# Patient Record
Sex: Male | Born: 1965 | Race: White | Hispanic: No
Health system: Southern US, Community
[De-identification: ages and names within clinical notes are randomized; demographics above are authoritative.]

---

## 2005-04-23 ENCOUNTER — Emergency Department (HOSPITAL_COMMUNITY): Admission: EM | Admit: 2005-04-23 | Discharge: 2005-04-23 | Payer: Self-pay | Admitting: *Deleted

## 2005-07-16 ENCOUNTER — Ambulatory Visit (HOSPITAL_COMMUNITY): Admission: RE | Admit: 2005-07-16 | Discharge: 2005-07-16 | Payer: Self-pay | Admitting: Neurosurgery

## 2008-12-29 ENCOUNTER — Emergency Department (HOSPITAL_COMMUNITY): Admission: EM | Admit: 2008-12-29 | Discharge: 2008-12-29 | Payer: Self-pay | Admitting: Emergency Medicine

## 2011-07-25 ENCOUNTER — Inpatient Hospital Stay (INDEPENDENT_AMBULATORY_CARE_PROVIDER_SITE_OTHER)
Admission: RE | Admit: 2011-07-25 | Discharge: 2011-07-25 | Disposition: A | Discharge status: Home / Self Care | Payer: Self-pay | Source: Ambulatory Visit | Attending: Emergency Medicine | Admitting: Emergency Medicine

## 2011-07-25 ENCOUNTER — Ambulatory Visit (INDEPENDENT_AMBULATORY_CARE_PROVIDER_SITE_OTHER): Payer: Self-pay

## 2011-07-25 DIAGNOSIS — S92919A Unspecified fracture of unspecified toe(s), initial encounter for closed fracture: Secondary | ICD-10-CM
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
## 2013-04-01 ENCOUNTER — Encounter (HOSPITAL_COMMUNITY): Payer: Self-pay | Admitting: *Deleted

## 2013-04-01 ENCOUNTER — Emergency Department (HOSPITAL_COMMUNITY)
Admission: EM | Admit: 2013-04-01 | Discharge: 2013-04-02 | Disposition: A | Discharge status: Home / Self Care | Payer: Self-pay | Attending: Emergency Medicine | Admitting: Emergency Medicine

## 2013-04-01 DIAGNOSIS — R32 Unspecified urinary incontinence: Secondary | ICD-10-CM | POA: Insufficient documentation

## 2013-04-01 DIAGNOSIS — E860 Dehydration: Secondary | ICD-10-CM | POA: Insufficient documentation

## 2013-04-01 DIAGNOSIS — R112 Nausea with vomiting, unspecified: Secondary | ICD-10-CM | POA: Insufficient documentation

## 2013-04-01 DIAGNOSIS — F1010 Alcohol abuse, uncomplicated: Secondary | ICD-10-CM | POA: Insufficient documentation

## 2013-04-01 DIAGNOSIS — F29 Unspecified psychosis not due to a substance or known physiological condition: Secondary | ICD-10-CM | POA: Insufficient documentation

## 2013-04-01 DIAGNOSIS — R569 Unspecified convulsions: Secondary | ICD-10-CM | POA: Insufficient documentation

## 2013-04-01 DIAGNOSIS — R55 Syncope and collapse: Secondary | ICD-10-CM | POA: Insufficient documentation

## 2013-04-01 DIAGNOSIS — F17200 Nicotine dependence, unspecified, uncomplicated: Secondary | ICD-10-CM | POA: Insufficient documentation

## 2013-04-01 DIAGNOSIS — H9319 Tinnitus, unspecified ear: Secondary | ICD-10-CM | POA: Insufficient documentation

## 2013-04-01 DIAGNOSIS — R42 Dizziness and giddiness: Secondary | ICD-10-CM | POA: Insufficient documentation

## 2013-04-01 LAB — CBC WITH DIFFERENTIAL/PLATELET
Basophils Absolute: 0 10*3/uL (ref 0.0–0.1)
Basophils Relative: 1 % (ref 0–1)
Hemoglobin: 14 g/dL (ref 13.0–17.0)
Lymphocytes Relative: 43 % (ref 12–46)
Lymphs Abs: 3.6 10*3/uL (ref 0.7–4.0)
MCH: 35.4 pg — ABNORMAL HIGH (ref 26.0–34.0)
MCHC: 35.3 g/dL (ref 30.0–36.0)
MCV: 100.3 fL — ABNORMAL HIGH (ref 78.0–100.0)
Monocytes Absolute: 0.7 10*3/uL (ref 0.1–1.0)
Neutrophils Relative %: 46 % (ref 43–77)
RBC: 3.96 MIL/uL — ABNORMAL LOW (ref 4.22–5.81)
WBC: 8.4 10*3/uL (ref 4.0–10.5)

## 2013-04-01 LAB — COMPREHENSIVE METABOLIC PANEL
ALT: 32 U/L (ref 0–53)
Albumin: 3.9 g/dL (ref 3.5–5.2)
BUN: 12 mg/dL (ref 6–23)
CO2: 30 mEq/L (ref 19–32)
GFR calc Af Amer: >90 mL/min (ref >90)
GFR calc non Af Amer: >90 mL/min (ref >90)
Glucose, Bld: 100 mg/dL — ABNORMAL HIGH (ref 70–99)
Total Bilirubin: 0.2 mg/dL — ABNORMAL LOW (ref 0.3–1.2)

## 2013-04-01 LAB — URINALYSIS, ROUTINE W REFLEX MICROSCOPIC
Bilirubin Urine: NEGATIVE
Glucose, UA: NEGATIVE mg/dL
Urobilinogen, UA: 0.2 mg/dL (ref 0.0–1.0)
pH: 6 (ref 5.0–8.0)

## 2013-04-01 LAB — URINE MICROSCOPIC-ADD ON

## 2013-04-01 LAB — RAPID URINE DRUG SCREEN, HOSP PERFORMED
Benzodiazepines: NOT DETECTED
Cocaine: NOT DETECTED
Opiates: NOT DETECTED
Tetrahydrocannabinol: POSITIVE — AB

## 2013-04-01 LAB — ETHANOL: Alcohol, Ethyl (B): 41 mg/dL — ABNORMAL HIGH (ref 0–11)

## 2013-04-01 MED ORDER — SODIUM CHLORIDE 0.9 % IV SOLN
1000.0000 mL | INTRAVENOUS | Status: DC
  Administered 2013-04-01: 1000 mL via INTRAVENOUS

## 2013-04-01 MED ORDER — SODIUM CHLORIDE 0.9 % IV SOLN
1000.0000 mL | Freq: Once | INTRAVENOUS | Status: AC
  Administered 2013-04-01: 1000 mL via INTRAVENOUS

## 2013-04-01 MED ORDER — ONDANSETRON HCL 4 MG/2ML IJ SOLN
4.0000 mg | Freq: Once | INTRAMUSCULAR | Status: AC
  Administered 2013-04-01: 4 mg via INTRAVENOUS
  Filled 2013-04-01: qty 2
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
## 2013-04-01 NOTE — ED Notes (Signed)
Delay explained to pt.  

## 2013-04-01 NOTE — ED Notes (Signed)
Per family, pt had LOC for about 1 minute.  Pt reports being outside today quite a bit and thinks he may be dehydrated.  At present, alert and oriented.  No known injury.

## 2013-04-02 MED ORDER — ONDANSETRON HCL 4 MG PO TABS: 4.0000 mg | ORAL_TABLET | Freq: Four times a day (QID) | ORAL | Status: AC

## 2013-04-04 LAB — POCT I-STAT TROPONIN I: Troponin i, poc: 0 ng/mL (ref 0.00–0.08)

## 2013-04-04 LAB — URINE CULTURE: Colony Count: NO GROWTH

## 2020-01-26 ENCOUNTER — Ambulatory Visit: Payer: Self-pay

## 2020-01-27 ENCOUNTER — Ambulatory Visit: Payer: Self-pay

## 2020-02-28 ENCOUNTER — Ambulatory Visit: Payer: Self-pay

## 2020-03-02 ENCOUNTER — Emergency Department (HOSPITAL_COMMUNITY)
Admission: EM | Admit: 2020-03-02 | Discharge: 2020-03-02 | Disposition: A | Discharge status: Home / Self Care | Payer: Self-pay | Attending: Emergency Medicine | Admitting: Emergency Medicine

## 2020-03-02 ENCOUNTER — Encounter (HOSPITAL_COMMUNITY): Payer: Self-pay

## 2020-03-02 ENCOUNTER — Other Ambulatory Visit: Payer: Self-pay

## 2020-03-02 DIAGNOSIS — M79604 Pain in right leg: Secondary | ICD-10-CM | POA: Insufficient documentation

## 2020-03-02 DIAGNOSIS — Z5321 Procedure and treatment not carried out due to patient leaving prior to being seen by health care provider: Secondary | ICD-10-CM | POA: Insufficient documentation

## 2020-03-02 DIAGNOSIS — R1031 Right lower quadrant pain: Secondary | ICD-10-CM

## 2020-03-02 NOTE — ED Triage Notes (Signed)
Pt reports has had intermittent pain in r groin and thigh for the past 2 or 3 months.  Reports was moving furniture yesterday and felt something pop in r thigh.

## 2020-03-05 ENCOUNTER — Encounter (HOSPITAL_COMMUNITY): Payer: Self-pay | Admitting: *Deleted

## 2020-04-16 ENCOUNTER — Emergency Department: Admission: EM | Admit: 2020-04-16 | Discharge: 2020-04-16 | Discharge status: Against Medical Advice | Payer: Self-pay

## 2020-04-16 NOTE — ED Triage Notes (Signed)
Pt requesting ice. Informed he needs to wait until triaged to make sure ok to drink.

## 2020-04-16 NOTE — ED Triage Notes (Signed)
First nurse note- pt fell on piece of pvc pipe on Friday or Saturday.  swelling to right submandibular. No dyspnea. Handling secretions.

## 2020-04-16 NOTE — ED Triage Notes (Signed)
Pt to first nurse desk and states " you can take me off the list I am going to get ice" and turned and walked out.

## 2022-11-19 ENCOUNTER — Other Ambulatory Visit: Payer: Self-pay

## 2022-11-19 ENCOUNTER — Emergency Department (HOSPITAL_COMMUNITY)
Admission: EM | Admit: 2022-11-19 | Discharge: 2022-11-19 | Disposition: A | Discharge status: Home / Self Care | Payer: 59 | Attending: Emergency Medicine | Admitting: Emergency Medicine

## 2022-11-19 ENCOUNTER — Encounter (HOSPITAL_COMMUNITY): Payer: Self-pay | Admitting: Emergency Medicine

## 2022-11-19 DIAGNOSIS — M542 Cervicalgia: Secondary | ICD-10-CM | POA: Diagnosis present

## 2022-11-19 DIAGNOSIS — M5416 Radiculopathy, lumbar region: Secondary | ICD-10-CM | POA: Insufficient documentation

## 2022-11-19 DIAGNOSIS — M5412 Radiculopathy, cervical region: Secondary | ICD-10-CM

## 2022-11-19 MED ORDER — TRAMADOL HCL 50 MG PO TABS: 50.0000 mg | ORAL_TABLET | Freq: Four times a day (QID) | ORAL | 0 refills | Status: AC | PRN

## 2022-11-19 MED ORDER — PREDNISONE 50 MG PO TABS
60.0000 mg | ORAL_TABLET | Freq: Once | ORAL | Status: AC
  Administered 2022-11-19: 60 mg via ORAL
  Filled 2022-11-19: qty 1

## 2022-11-19 MED ORDER — PREDNISONE 20 MG PO TABS: ORAL_TABLET | ORAL | 0 refills | Status: AC

## 2022-11-19 NOTE — ED Triage Notes (Addendum)
Pt c/o right sided neck, shoulder and arm pain that has been increasing for the past 5 days. Pt states hx of problems with C3, C4, and C5. Pt states he had this pain before but never this severe.

## 2022-11-19 NOTE — ED Provider Notes (Signed)
[facility] Provider Note   CSN: [id number] Arrival date & time: 11/19/22  0551     History  Chief Complaint  Patient presents with   Neck Pain    Kyle Kaiser is a 57 y.o. male.  Patient is a 57 year old male with no significant past medical history.  Patient presenting today with complaints of right-sided neck pain, shoulder pain.  This has been worsening over the past 5 days.  It began in the absence of any specific injury or trauma.  He does report lifting items at work, but does not recall actually injuring himself.  The pain radiates down his arm and causes numbness to the dorsum of his hand and first, second, and third fingers.  He denies any weakness or difficulty grasping objects.  The history is provided by the patient.       Home Medications Prior to Admission medications   Medication Sig Start Date End Date Taking? Authorizing Provider  ondansetron (ZOFRAN) 4 MG tablet Take 1 tablet (4 mg total) by mouth every 6 (six) hours. 04/02/13   Rolland Porter, MD      Allergies    Patient has no known allergies.    Review of Systems   Review of Systems  All other systems reviewed and are negative.   Physical Exam Updated Vital Signs BP (!) 159/87   Pulse 64   Temp 97.8 F (36.6 C) (Oral)   Resp 16   Ht 6' (1.829 m)   Wt 93 kg   SpO2 99%   BMI 27.81 kg/m  Physical Exam Vitals and nursing note reviewed.  Constitutional:      General: He is not in acute distress.    Appearance: Normal appearance. He is not ill-appearing.  HENT:     Head: Normocephalic and atraumatic.  Neck:     Comments: There is tenderness to palpation in the soft tissues of the right lateral neck. Pulmonary:     Effort: Pulmonary effort is normal.  Musculoskeletal:     Cervical back: Normal range of motion and neck supple. No rigidity or tenderness.     Comments: The right upper extremity is neurovascularly intact.  He has good ulnar and radial pulses.  He is able to  flex, extend, and oppose all fingers.  Sensation is intact throughout the entire hand.  Skin:    General: Skin is warm and dry.  Neurological:     Mental Status: He is alert.     ED Results / Procedures / Treatments   Labs (all labs ordered are listed, but only abnormal results are displayed) Labs Reviewed - No data to display  EKG None  Radiology No results found.  Procedures Procedures    Medications Ordered in ED Medications  predniSONE (DELTASONE) tablet 60 mg (has no administration in time range)    ED Course/ Medical Decision Making/ A&P  Patient presenting with complaints of neck pain radiating down his right arm.  This has been apparently an issue for him since his 74s.  He tells me that this has come and gone intermittently since.  He generally takes anti-inflammatory medications, however this does not seem to be helping now.  On exam, there is tenderness to palpation of the soft tissues of the right upper trapezius and posterior neck.  The right upper extremity appears neurovascularly intact.  Patient presenting with signs and symptoms of a cervical radiculopathy and will be treated with prednisone, tramadol, and as needed return.  Final Clinical  Impression(s) / ED Diagnoses Final diagnoses:  None    Rx / DC Orders ED Discharge Orders     None         Veryl Speak, MD 11/19/22 [phone]

## 2022-11-19 NOTE — Discharge Instructions (Addendum)
Begin taking prednisone as prescribed.  Take tramadol as prescribed as needed for pain.  Follow-up with a primary doctor if your symptoms or not improving in the next 1 to 2 weeks.
